# Patient Record
Sex: Female | Born: 1955 | Race: White | Hispanic: No | Marital: Married | Smoking: Never smoker
Health system: Southern US, Community
[De-identification: ages and names within clinical notes are randomized; demographics above are authoritative.]

## PROBLEM LIST (undated history)

## (undated) DIAGNOSIS — G43909 Migraine, unspecified, not intractable, without status migrainosus: Secondary | ICD-10-CM

## (undated) DIAGNOSIS — E785 Hyperlipidemia, unspecified: Secondary | ICD-10-CM

## (undated) DIAGNOSIS — N200 Calculus of kidney: Secondary | ICD-10-CM

## (undated) HISTORY — DX: Migraine, unspecified, not intractable, without status migrainosus: G43.909

## (undated) HISTORY — DX: Hyperlipidemia, unspecified: E78.5

## (undated) HISTORY — DX: Calculus of kidney: N20.0

## (undated) HISTORY — PX: KNEE SURGERY: SHX244

---

## 1999-06-04 ENCOUNTER — Other Ambulatory Visit: Admission: RE | Admit: 1999-06-04 | Discharge: 1999-06-04 | Payer: Self-pay | Admitting: Family Medicine

## 1999-10-13 ENCOUNTER — Ambulatory Visit (HOSPITAL_BASED_OUTPATIENT_CLINIC_OR_DEPARTMENT_OTHER): Admission: RE | Admit: 1999-10-13 | Discharge: 1999-10-14 | Payer: Self-pay | Admitting: Orthopaedic Surgery

## 2001-10-19 ENCOUNTER — Other Ambulatory Visit: Admission: RE | Admit: 2001-10-19 | Discharge: 2001-10-19 | Payer: Self-pay | Admitting: Obstetrics and Gynecology

## 2003-01-08 ENCOUNTER — Other Ambulatory Visit: Admission: RE | Admit: 2003-01-08 | Discharge: 2003-01-08 | Payer: Self-pay | Admitting: Obstetrics and Gynecology

## 2004-06-02 ENCOUNTER — Other Ambulatory Visit: Admission: RE | Admit: 2004-06-02 | Discharge: 2004-06-02 | Payer: Self-pay | Admitting: Obstetrics and Gynecology

## 2005-11-12 ENCOUNTER — Other Ambulatory Visit: Admission: RE | Admit: 2005-11-12 | Discharge: 2005-11-12 | Payer: Self-pay | Admitting: Obstetrics & Gynecology

## 2007-02-15 ENCOUNTER — Other Ambulatory Visit: Admission: RE | Admit: 2007-02-15 | Discharge: 2007-02-15 | Payer: Self-pay | Admitting: Obstetrics and Gynecology

## 2008-03-28 ENCOUNTER — Other Ambulatory Visit: Admission: RE | Admit: 2008-03-28 | Discharge: 2008-03-28 | Payer: Self-pay | Admitting: Obstetrics and Gynecology

## 2011-02-05 NOTE — Op Note (Signed)
[place]. [facility]  Fowler:    Angela Fowler                         MRN: [id number] Proc. Date: 10/13/99 Adm. Date:  [id number] Attending:  Marcene Corning                           Operative Report  PREOPERATIVE DIAGNOSES: 1. Right anterior cruciate ligament tear. 2. Right torn lateral meniscus.  POSTOPERATIVE DIAGNOSES: 1. Right anterior cruciate ligament tear. 2. Right chondromalacia of patella.  PROCEDURES: 1. Right ACL reconstruction. 2. Right knee chondroplasty.  ANESTHESIA:  General.  SURGEON:  Lubertha Basque. Jerl Santos, M.D.  ASSISTANT:  Angela Fowler, P.A.  INDICATIONS FOR PROCEDURE:  Angela Fowler is a 55 year old woman who is a very active athlete.  About 12 years ago, she sustained an injury to her right knee.  She states she underwent an arthroscopy which confirmed an ACL tear.  She had extensive rehabilitation and wore a brace, but unfortunately at this point, had giving away episodes when she tries to exert herself athletically.  She had stretched out her secondary stabilizers, and at this point is offered ACL reconstruction.  Angela procedure was discussed with Angela Fowler and informed operative consent was obtained after discussion of possible complications of reaction of anesthesia and infection.  Angela different graft types were discussed in detail.  She elected to go forth with an allograft.  DESCRIPTION OF PROCEDURE:  Angela Fowler was taken to Angela operating suite where a  general anesthetic was applied with LMA without difficulty.  She was positioned  supine, and prepped and draped in a normal sterile fashion.  After Angela administration of preoperative antibiotics and arthroscopy of Angela right knee was performed through two inferior portals.  Angela suprapatellar pouch was benign while Angela patellofemoral joint exhibited some grade 3 chondromalacia of Angela intertrochlear groove with some loose flaps of cartilage.  A  thorough chondroplasty was performed back to stable structures.  Angela patella actually tracked very well. Both gutters were benign.  Medial compartment and lateral compartment exhibited no evidence of meniscal or articular cartilage injury.  ACL was absent with a small remnant of scar tissue attached to Angela notch.  PCL was intact.  She had notch stenosis.  A thorough notchplasty was performed with an arthroscopic bur to open up this area.  A guidewire was placed into Angela knee through a stab wound on Angela anteromedial aspect of Angela tibia.  This entered Angela knee just anterior to Angela PCL, and was placed with an appropriate guide.  This was then over-reamed to a diameter of 10 mm.  A second guidewire was placed with a transtibial guide and over Angela op position.  This exited Angela thigh proximally.  A 9 mm reamer was then used to create a tunnel in Angela distal femur over this guidewire.  A 1-2 mm posterior wire was visualized and was intact.  A full radius resector was placed in Angela knee to remove residual bone chips.  While this was being performed, [name] was at a back table preparing Angela  allograft to fit through Angela 9 mm and 10 mm tunnels.  This was thoroughly _____. Drill holes were placed in each of Angela bone plugs and PDS sutures were placed in Angela leading bone plug while was a wire was placed in Angela trailing bone plug. Angela leading PDS sutures were then  threaded through a guidewire which passed through Angela tibial tunnel, Angela femoral tunnel, and out Angela proximal thigh.  These were used to guide Angela graft through both tunnels, and placed it in proper position.  Care was taken to keep Angela tendon insertion _______ in a posterior direction.  There was  some mismatch between Angela allograft and Angela Fowler, and as a result, Angela leading bone plug was split on itself to shorten Angela graft by about 2 cm.  Once this was accomplished, Angela graft was an appropriate length and fit into  Angela tunnel perfectly.  Angela knee was then hyperflexed and a guidewire placed anteriorly in he femoral tunnel.  An 8 x 25 metal [employer] screw was placed in an anterior position and interference fashion.  Once this was accomplished, Angela knee was ranged fully and Angela graft was found to be asymmetric.  A second guidewire was placed through Angela tibial tunnel and was seen to enter Angela knee, arthroscopically cleared, and a second identical screw was placed in an identical fashion over this guidewire to secure Angela tibial bone plug.  Angela knee was thoroughly irrigated.  Angela knee easily again to full extension with no impingement.  Arthroscopic equipment was removed.  Simple sutures of vertical mattress nylon were used to reapproximate Angela small wound on Angela anteromedial aspect of Angela tibia.  Adaptic was placed over all wounds and portal followed by dry gauze and loose Ace wrap.  Estimated blood loss and intraoperative fluids can be obtained from anesthesia records.  No tourniquet was used.  DISPOSITION:  Angela Fowler was extubated in Angela operating room and taken to Angela recovery room in stable condition.  Plans were for her to be admitted for overnight observation for pain control with plans to go home in Angela morning. DD:  10/13/99 TD:  10/13/99 Job: [id number]

## 2012-10-05 ENCOUNTER — Other Ambulatory Visit (HOSPITAL_COMMUNITY)
Admission: RE | Admit: 2012-10-05 | Discharge: 2012-10-05 | Disposition: A | Discharge status: Home / Self Care | Payer: BC Managed Care – PPO | Source: Ambulatory Visit | Attending: Family Medicine | Admitting: Family Medicine

## 2012-10-05 ENCOUNTER — Other Ambulatory Visit: Payer: Self-pay | Admitting: Family Medicine

## 2012-10-05 DIAGNOSIS — Z124 Encounter for screening for malignant neoplasm of cervix: Secondary | ICD-10-CM | POA: Insufficient documentation

## 2015-08-25 ENCOUNTER — Other Ambulatory Visit (HOSPITAL_COMMUNITY)
Admission: RE | Admit: 2015-08-25 | Discharge: 2015-08-25 | Disposition: A | Discharge status: Home / Self Care | Payer: BC Managed Care – PPO | Source: Ambulatory Visit | Attending: Family Medicine | Admitting: Family Medicine

## 2015-08-25 ENCOUNTER — Other Ambulatory Visit: Payer: Self-pay | Admitting: Family Medicine

## 2015-08-25 DIAGNOSIS — Z124 Encounter for screening for malignant neoplasm of cervix: Secondary | ICD-10-CM | POA: Insufficient documentation

## 2015-08-27 LAB — CYTOLOGY - PAP

## 2018-02-14 ENCOUNTER — Other Ambulatory Visit (HOSPITAL_COMMUNITY)
Admission: RE | Admit: 2018-02-14 | Discharge: 2018-02-14 | Disposition: A | Discharge status: Home / Self Care | Payer: BC Managed Care – PPO | Source: Ambulatory Visit | Attending: Family Medicine | Admitting: Family Medicine

## 2018-02-14 ENCOUNTER — Other Ambulatory Visit: Payer: Self-pay | Admitting: Family Medicine

## 2018-02-14 DIAGNOSIS — Z124 Encounter for screening for malignant neoplasm of cervix: Secondary | ICD-10-CM | POA: Diagnosis present

## 2018-02-16 LAB — CYTOLOGY - PAP: Diagnosis: NEGATIVE

## 2019-11-30 ENCOUNTER — Ambulatory Visit: Payer: BC Managed Care – PPO | Attending: Internal Medicine

## 2019-11-30 DIAGNOSIS — Z23 Encounter for immunization: Secondary | ICD-10-CM

## 2019-11-30 NOTE — Progress Notes (Signed)
   Covid-19 Vaccination Clinic  Name:  Angela Fowler    MRN: [id number] DOB: 05/27/1956  11/30/2019  [name] was observed post Covid-19 immunization for 15 minutes without incident. She was provided with Vaccine Information Sheet and instruction to access the V-Safe system.   [name] was instructed to call 911 with any severe reactions post vaccine: Marland Kitchen Difficulty breathing  . Swelling of face and throat  . A fast heartbeat  . A bad rash all over body  . Dizziness and weakness   Immunizations Administered    Name Date Dose VIS Date Route   Pfizer COVID-19 Vaccine 11/30/2019 10:50 AM 0.3 mL 08/31/2019 Intramuscular   Manufacturer: [employer], [employer]   Lot: [id number]   NDC: [address]-0

## 2019-12-26 ENCOUNTER — Ambulatory Visit: Payer: BC Managed Care – PPO | Attending: Internal Medicine

## 2019-12-26 DIAGNOSIS — Z23 Encounter for immunization: Secondary | ICD-10-CM

## 2019-12-26 NOTE — Progress Notes (Signed)
   Covid-19 Vaccination Clinic  Name:  Amra Shukla    MRN: [id number] DOB: 12-Jul-1956  12/26/2019  Ms. Pippen was observed post Covid-19 immunization for 15 minutes without incident. She was provided with Vaccine Information Sheet and instruction to access the V-Safe system.   Ms. Gavel was instructed to call 911 with any severe reactions post vaccine: Marland Kitchen Difficulty breathing  . Swelling of face and throat  . A fast heartbeat  . A bad rash all over body  . Dizziness and weakness   Immunizations Administered    Name Date Dose VIS Date Route   Pfizer COVID-19 Vaccine 12/26/2019  9:08 AM 0.3 mL 08/31/2019 Intramuscular   Manufacturer: [employer], [employer]   Lot: [id number]   NDC: [address]-9

## 2020-03-08 ENCOUNTER — Other Ambulatory Visit: Payer: Self-pay

## 2020-03-08 ENCOUNTER — Ambulatory Visit (HOSPITAL_COMMUNITY): Admission: EM | Admit: 2020-03-08 | Payer: BC Managed Care – PPO | Source: Home / Self Care

## 2021-10-07 NOTE — Progress Notes (Signed)
Date:  10/08/2021   ID:  Angela Fowler, DOB 03/27/1956, MRN [id number]  PCP:  [facility] @ [place]  Cardiologist:  Rex Kras, DO, [facility] (established care 10/08/2021)  REASON FOR CONSULT: Chest tightness   REQUESTING PHYSICIAN:  [facility] [place] @ [place],  [place] [address]  Chief Complaint  Patient presents with   Chest Pain   New Patient (Initial Visit)    HPI  Angela Fowler is a 66 y.o. Caucasian female who presents to the office with a chief complaint of " chest pain." Patient's past medical history and cardiovascular risk factors include: Hyperlipidemia, nephrolithiasis, diverticulosis, family history of heart disease, migraine, postmenopausal female.  She is referred to the office at the request of [facility], [facility] for evaluation of chest tightness.  Chest pain: Patient has been experiencing discomfort underneath her left breast, describes it as a squeezing/tightness like sensation, usually more noticeable while she is in bed either going to sleep or waking up.  The discomfort has been present for the last 6 to 7 months.  Initially she thought it could be musculoskeletal however the discomfort has not changed with stretches and exercises.  The discomfort is intermittently present, lasting 15 to 20 minutes, self-limited.  The chest discomfort is not brought on by effort related activities and does not resolve with rest.  No change in overall functional status.  She walks 3 to 4 miles 3 days a week and also participates in yoga class.  No family history of premature coronary disease or sudden cardiac death.  However, 2 brothers had myocardial infarctions in their mid 15s.  FUNCTIONAL STATUS: Walks 3 miles about 3-4 times a week and also participates in yoga class.  ALLERGIES: No Known Allergies  MEDICATION LIST PRIOR TO VISIT: Current Meds  Medication Sig   fluticasone (FLONASE) 50 MCG/ACT nasal spray  Place 2 sprays into both nostrils daily.   naproxen sodium (ALEVE) 220 MG tablet Take by mouth.   rizatriptan (MAXALT-MLT) 5 MG disintegrating tablet Take 1 tablet by mouth as needed.     PAST MEDICAL HISTORY: Past Medical History:  Diagnosis Date   Hyperlipidemia    Migraine    Nephrolithiasis     PAST SURGICAL HISTORY: Past Surgical History:  Procedure Laterality Date   KNEE SURGERY Right     FAMILY HISTORY: The patient family history includes Cancer in her father; Heart disease in her brother, brother, and mother; Heart failure in her mother.  SOCIAL HISTORY:  The patient  reports that she has never smoked. She has never used smokeless tobacco. She reports current alcohol use. She reports that she does not use drugs.  REVIEW OF SYSTEMS: Review of Systems  Constitutional: Negative for chills and fever.  HENT:  Negative for hoarse voice and nosebleeds.   Eyes:  Negative for discharge, double vision and pain.  Cardiovascular:  Negative for chest pain, claudication, dyspnea on exertion, leg swelling, near-syncope, orthopnea, palpitations, paroxysmal nocturnal dyspnea and syncope.  Respiratory:  Negative for hemoptysis and shortness of breath.   Musculoskeletal:  Negative for muscle cramps and myalgias.  Gastrointestinal:  Negative for abdominal pain, constipation, diarrhea, hematemesis, hematochezia, melena, nausea and vomiting.  Neurological:  Negative for dizziness and light-headedness.   PHYSICAL EXAM: Vitals with BMI 10/08/2021  Height $Remov'5\' 2"'zyZQKS$   Weight 151 lbs  BMI 17.49  Systolic 449  Diastolic 73  Pulse 84    CONSTITUTIONAL: Well-developed and well-nourished. No acute distress.  SKIN: Skin  is warm and dry. No rash noted. No cyanosis. No pallor. No jaundice HEAD: Normocephalic and atraumatic.  EYES: No scleral icterus MOUTH/THROAT: Moist oral membranes.  NECK: No JVD present. No thyromegaly noted. No carotid bruits  LYMPHATIC: No visible cervical adenopathy.   CHEST Normal respiratory effort. No intercostal retractions  LUNGS: Clear to auscultation bilaterally.  No stridor. No wheezes. No rales.  CARDIOVASCULAR: Regular rate and rhythm, positive S1-S2, no murmurs rubs or gallops appreciated. ABDOMINAL: Obese, soft, nontender, nondistended, positive bowel sounds all 4 quadrants. No apparent ascites.  EXTREMITIES: No peripheral edema, warm to touch, +2 bilateral DP and PT pulses HEMATOLOGIC: No significant bruising NEUROLOGIC: Oriented to person, place, and time. Nonfocal. Normal muscle tone.  PSYCHIATRIC: Normal mood and affect. Normal behavior. Cooperative  CARDIAC DATABASE: EKG: 10/08/2021: NSR, 79 bpm, low voltage in the precordial leads, nonspecific ST-T changes in the high lateral leads, without underlying injury pattern.   Echocardiogram: No results found for this or any previous visit from the past 1095 days.   Stress Testing: No results found for this or any previous visit from the past 1095 days.   Heart Catheterization: None  LABORATORY DATA: No flowsheet data found.  No flowsheet data found.  Lipid Panel  No results found for: CHOL, TRIG, HDL, CHOLHDL, VLDL, LDLCALC, LDLDIRECT, LABVLDL  No components found for: NTPROBNP No results for input(s): PROBNP in the last 8760 hours. No results for input(s): TSH in the last 8760 hours.  BMP No results for input(s): NA, K, CL, CO2, GLUCOSE, BUN, CREATININE, CALCIUM, GFRNONAA, GFRAA in the last 8760 hours.  HEMOGLOBIN A1C No results found for: HGBA1C, MPG  External Labs:  Date Collected: 03/10/2021 , information obtained by referring provider Potassium: 4.7 Creatinine 0.67 mg/dL. eGFR: 97 mL/min per 1.73 m Hemoglobin: 13.2 g/dL and hematocrit: 39.6 % Lipid profile: Total cholesterol 216 , triglycerides 161 , HDL 53 , LDL 135, non-HDL 164 AST: 17 , ALT: 14 , alkaline phosphatase: 68  TSH: 3.65    IMPRESSION:    ICD-10-CM   1. Precordial pain  R07.2 EKG 12-Lead    PCV  ECHOCARDIOGRAM COMPLETE    PCV CARDIAC STRESS TEST    CT CARDIAC SCORING ([facility] LOCATIONS ONLY)    2. Pure hypercholesterolemia  E78.00     3. Family history of heart disease  Z82.49        RECOMMENDATIONS: [name] is a 66 y.o. Caucasian female whose past medical history and cardiac risk factors include: Hyperlipidemia, nephrolithiasis, diverticulosis, family history of heart disease, migraine, postmenopausal female.  Precordial pain Not entirely cardiac discomfort, but due to risk factors additional work-up warranted. EKG: Sinus, nonspecific ST-T changes in the high lateral leads Echocardiogram will be ordered to evaluate for structural heart disease and left ventricular systolic function. Exercise treadmill stress test to evaluate for functional status and exercise-induced ischemia/arrhythmia. Coronary calcium score for further risk stratification given her hyperlipidemia and family history of CAD.  Pure hypercholesterolemia Most recent labs from June 2022 reviewed. 10-year risk of ASCVD 4.9%. Coronary calcium score ordered for further risk stratification. Further recommendations forthcoming.  Family history of heart disease Educated on the importance of improving her modifiable cardiovascular risk factors.  Work-up as discussed above.  FINAL MEDICATION LIST END OF ENCOUNTER: No orders of the defined types were placed in this encounter.   There are no discontinued medications.   Current Outpatient Medications:    fluticasone (FLONASE) 50 MCG/ACT nasal spray, Place 2 sprays into both nostrils daily., Disp: , Rfl:  naproxen sodium (ALEVE) 220 MG tablet, Take by mouth., Disp: , Rfl:    rizatriptan (MAXALT-MLT) 5 MG disintegrating tablet, Take 1 tablet by mouth as needed., Disp: , Rfl:   Orders Placed This Encounter  Procedures   CT CARDIAC SCORING ([facility] LOCATIONS ONLY)   PCV CARDIAC STRESS TEST   EKG 12-Lead   PCV ECHOCARDIOGRAM COMPLETE    There are no Patient  Instructions on file for this visit.   --Continue cardiac medications as reconciled in final medication list. --Return in about 4 weeks (around 11/05/2021) for Follow up, Chest pain, Review test results. Or sooner if needed. --Continue follow-up with your primary care physician regarding the management of your other chronic comorbid conditions.  Patient's questions and concerns were addressed to her satisfaction. She voices understanding of the instructions provided during this encounter.   This note was created using a voice recognition software as a result there may be grammatical errors inadvertently enclosed that do not reflect the nature of this encounter. Every attempt is made to correct such errors.  Rex Kras, [place], [facility]  Pager: [phone] Office: [phone]

## 2021-10-08 ENCOUNTER — Encounter: Payer: Self-pay | Admitting: Cardiology

## 2021-10-08 ENCOUNTER — Other Ambulatory Visit: Payer: Self-pay

## 2021-10-08 ENCOUNTER — Ambulatory Visit: Payer: BC Managed Care – PPO | Admitting: Cardiology

## 2021-10-08 VITALS — BP 122/73 | HR 84 | Temp 98.0°F | Resp 16 | Ht 62.0 in | Wt 151.0 lb

## 2021-10-08 DIAGNOSIS — R072 Precordial pain: Secondary | ICD-10-CM

## 2021-10-08 DIAGNOSIS — Z8249 Family history of ischemic heart disease and other diseases of the circulatory system: Secondary | ICD-10-CM

## 2021-10-08 DIAGNOSIS — E7800 Pure hypercholesterolemia, unspecified: Secondary | ICD-10-CM

## 2021-10-19 ENCOUNTER — Other Ambulatory Visit: Payer: Self-pay

## 2021-10-19 ENCOUNTER — Ambulatory Visit: Payer: BC Managed Care – PPO

## 2021-10-19 DIAGNOSIS — R072 Precordial pain: Secondary | ICD-10-CM

## 2021-10-29 ENCOUNTER — Ambulatory Visit
Admission: RE | Admit: 2021-10-29 | Discharge: 2021-10-29 | Disposition: A | Discharge status: Home / Self Care | Payer: No Typology Code available for payment source | Source: Ambulatory Visit | Attending: Cardiology | Admitting: Cardiology

## 2021-10-29 DIAGNOSIS — R072 Precordial pain: Secondary | ICD-10-CM

## 2021-11-03 NOTE — Progress Notes (Signed)
Results fwd to PCP. Called and spoke to pt, pt aware.

## 2021-11-06 ENCOUNTER — Ambulatory Visit: Payer: BC Managed Care – PPO

## 2021-11-06 ENCOUNTER — Other Ambulatory Visit: Payer: Self-pay

## 2021-11-06 DIAGNOSIS — R072 Precordial pain: Secondary | ICD-10-CM

## 2021-11-13 ENCOUNTER — Ambulatory Visit: Payer: BC Managed Care – PPO | Admitting: Cardiology

## 2021-11-17 ENCOUNTER — Ambulatory Visit: Payer: BC Managed Care – PPO | Admitting: Cardiology

## 2021-11-17 ENCOUNTER — Other Ambulatory Visit: Payer: Self-pay

## 2021-11-17 ENCOUNTER — Encounter: Payer: Self-pay | Admitting: Cardiology

## 2021-11-17 VITALS — BP 119/74 | HR 91 | Temp 97.8°F | Resp 16 | Ht 62.0 in | Wt 147.6 lb

## 2021-11-17 DIAGNOSIS — Z712 Person consulting for explanation of examination or test findings: Secondary | ICD-10-CM

## 2021-11-17 DIAGNOSIS — E7800 Pure hypercholesterolemia, unspecified: Secondary | ICD-10-CM

## 2021-11-17 DIAGNOSIS — R072 Precordial pain: Secondary | ICD-10-CM

## 2021-11-17 DIAGNOSIS — Z8249 Family history of ischemic heart disease and other diseases of the circulatory system: Secondary | ICD-10-CM

## 2021-11-17 NOTE — Progress Notes (Signed)
Date:  11/17/2021   ID:  Angela Fowler, DOB Jun 19, 1956, MRN [id number]  PCP:  Daisy Floro, MD  Cardiologist:  Tessa Lerner, DO, [facility] (established care 10/08/2021)  Date: 11/17/21 Last Office Visit: 10/08/2021  Chief Complaint  Patient presents with   Chest Pain   Results   Follow-up    HPI  Angela Fowler is a 66 y.o. Caucasian female who presents to the office with a chief complaint of " reevaluation of chest pain and discuss test results." Patient's past medical history and cardiovascular risk factors include: Hyperlipidemia, nephrolithiasis, diverticulosis, family history of heart disease, migraine, postmenopausal female.  She is referred to the office at the request of [facility] for evaluation of chest tightness.  Patient presented to the office back in January 2023 after being referred for evaluation of chest tightness.  Her symptoms of precordial discomfort appear to be not entirely cardiac but given her risk factors additional work-up was undertaken.  She underwent an echocardiogram, exercise treadmill stress test, and a coronary artery calcium score.  Results reviewed with her in great detail and noted below for further reference.  Clinically she no longer is experiencing chest pain/tightness.  No heart failure symptoms.  No recent hospitalizations or urgent care visits for cardiovascular symptoms.  Her functional status remains relatively stable she walks 3 to 4 miles 3 days a week and also does yoga classes.  No family history of premature coronary disease or sudden cardiac death.  However, 2 brothers had myocardial infarctions in their mid 49s.  FUNCTIONAL STATUS: Walks 3 miles about 3-4 times a week and also participates in yoga class.  ALLERGIES: No Known Allergies  MEDICATION LIST PRIOR TO VISIT: Current Meds  Medication Sig   fluticasone (FLONASE) 50 MCG/ACT nasal spray Place 2 sprays into both nostrils daily.   naproxen sodium (ALEVE) 220 MG  tablet Take by mouth.   rizatriptan (MAXALT-MLT) 5 MG disintegrating tablet Take 1 tablet by mouth as needed.     PAST MEDICAL HISTORY: Past Medical History:  Diagnosis Date   Hyperlipidemia    Migraine    Nephrolithiasis     PAST SURGICAL HISTORY: Past Surgical History:  Procedure Laterality Date   KNEE SURGERY Right     FAMILY HISTORY: The patient family history includes Cancer in her father; Heart disease in her brother, brother, and mother; Heart failure in her mother.  SOCIAL HISTORY:  The patient  reports that she has never smoked. She has never used smokeless tobacco. She reports current alcohol use. She reports that she does not use drugs.  REVIEW OF SYSTEMS: Review of Systems  Cardiovascular:  Negative for chest pain, dyspnea on exertion, leg swelling, orthopnea, palpitations, paroxysmal nocturnal dyspnea and syncope.  Respiratory:  Negative for shortness of breath.    PHYSICAL EXAM: Vitals with BMI 11/17/2021 10/08/2021  Height 5\' 2"  5\' 2"   Weight 147 lbs 10 oz 151 lbs  BMI 26.99 27.61  Systolic 119 122  Diastolic 74 73  Pulse 91 84    CONSTITUTIONAL: Well-developed and well-nourished. No acute distress.  SKIN: Skin is warm and dry. No rash noted. No cyanosis. No pallor. No jaundice HEAD: Normocephalic and atraumatic.  EYES: No scleral icterus MOUTH/THROAT: Moist oral membranes.  NECK: No JVD present. No thyromegaly noted. No carotid bruits  LYMPHATIC: No visible cervical adenopathy.  CHEST Normal respiratory effort. No intercostal retractions  LUNGS: Clear to auscultation bilaterally.  No stridor. No wheezes. No rales.  CARDIOVASCULAR: Regular rate and rhythm,  positive S1-S2, no murmurs rubs or gallops appreciated. ABDOMINAL: Obese, soft, nontender, nondistended, positive bowel sounds all 4 quadrants. No apparent ascites.  EXTREMITIES: No peripheral edema, warm to touch, +2 bilateral DP and PT pulses HEMATOLOGIC: No significant bruising NEUROLOGIC:  Oriented to person, place, and time. Nonfocal. Normal muscle tone.  PSYCHIATRIC: Normal mood and affect. Normal behavior. Cooperative  CARDIAC DATABASE: EKG: 10/08/2021: NSR, 79 bpm, low voltage in the precordial leads, nonspecific ST-T changes in the high lateral leads, without underlying injury pattern.   Echocardiogram: 10/19/2021: Left ventricle cavity is normal in size and wall thickness. Normal global wall motion. Normal LV systolic function with EF 58%. Doppler evidence of grade I (impaired) diastolic dysfunction, normal LAP.  Mild (Grade I) mitral regurgitation. Mild tricuspid regurgitation. No evidence of pulmonary hypertension. Incidental finding of 3X3 cm hypoechoic structure in liver, probably simple cyst.   Stress Testing: Exercise treadmill stress test 11/06/2021: Exercise treadmill stress test performed using Bruce protocol. Patient reached 8.5 METS, and 101% of age predicted maximum heart rate. Exercise capacity was low. No chest pain reported. Normal heart rate and hemodynamic response. Stress EKG revealed no ischemic changes. Low risk study.  Heart Catheterization: None  CT Cardiac Scoring: 10/30/2021 Total CAC 0AU. Noncardiac findings: 2 mm pulmonary nodule in the right middle lobe, nonspecific, but statistically likely benign. No follow-up is needed.  LABORATORY DATA: No flowsheet data found.  No flowsheet data found.  Lipid Panel  No results found for: CHOL, TRIG, HDL, CHOLHDL, VLDL, LDLCALC, LDLDIRECT, LABVLDL  No components found for: NTPROBNP No results for input(s): PROBNP in the last 8760 hours. No results for input(s): TSH in the last 8760 hours.  BMP No results for input(s): NA, K, CL, CO2, GLUCOSE, BUN, CREATININE, CALCIUM, GFRNONAA, GFRAA in the last 8760 hours.  HEMOGLOBIN A1C No results found for: HGBA1C, MPG  External Labs:  Date Collected: 03/10/2021 , information obtained by referring provider Potassium: 4.7 Creatinine 0.67 mg/dL. eGFR:  97 mL/min per 1.73 m Hemoglobin: 13.2 g/dL and hematocrit: 39.6 % Lipid profile: Total cholesterol 216 , triglycerides 161 , HDL 53 , LDL 135, non-HDL 164 AST: 17 , ALT: 14 , alkaline phosphatase: 68  TSH: 3.65    IMPRESSION:    ICD-10-CM   1. Precordial pain  R07.2     2. Pure hypercholesterolemia  E78.00     3. Family history of heart disease  Z82.49     4. Encounter to discuss test results  Z71.2         RECOMMENDATIONS: Angela Fowler is a 66 y.o. Caucasian female whose past medical history and cardiac risk factors include: Hyperlipidemia, nephrolithiasis, diverticulosis, family history of heart disease, migraine, postmenopausal female.  Precordial pain Asymptomatic, since last visit. Echocardiogram: Preserved LVEF, grade 1 diastolic dysfunction, mild MR/TR. GXT: Low risk study. Coronary calcium score: 0 No additional work-up warranted at this time.  Pure hypercholesterolemia Patient is educated on the importance of improving her modifiable cardiovascular risk factors, increasing physical activity as tolerated with a goal of 30 minutes a day 5 days a week, consuming foods that are low in cholesterol. Total coronary calcium score is 0. 10-year risk of ASCVD is less than 5%. The shared decision was to hold off on pharmacological therapy for now and to have it reevaluated.  Patient states that she follows up with her PCP next week and will have repeat labs. Monitor for now  Encounter to discuss test results Noted to have an incidental 2 mm pulmonary nodule noted on  coronary calcium report.  She is a non-smoker and therefore likely insignificant.  However, I have asked her to follow-up with her PCP to see if additional work-up is warranted.  We will defer additional management to PCP for now. Echocardiogram also noted an incidental finding of a possible liver cyst.  I have asked her to discuss this further with her PCP and consider ultrasound of the abdomen for further  evaluation.  No additional cardiovascular work-up warranted at this time.  However, given the fact she has risk factors that shared decision was to follow-up on annual basis for primary prevention.   Discussed management of at least 2 conditions, reviewed results of the echo, stress test, coronary calcium report, discussed the importance of primary prevention and annual follow up or sooner if needed.   FINAL MEDICATION LIST END OF ENCOUNTER: No orders of the defined types were placed in this encounter.   There are no discontinued medications.   Current Outpatient Medications:    fluticasone (FLONASE) 50 MCG/ACT nasal spray, Place 2 sprays into both nostrils daily., Disp: , Rfl:    naproxen sodium (ALEVE) 220 MG tablet, Take by mouth., Disp: , Rfl:    rizatriptan (MAXALT-MLT) 5 MG disintegrating tablet, Take 1 tablet by mouth as needed., Disp: , Rfl:   No orders of the defined types were placed in this encounter.   There are no Patient Instructions on file for this visit.   --Continue cardiac medications as reconciled in final medication list. --Return in about 1 year (around 11/17/2022) for Follow up . Or sooner if needed. --Continue follow-up with your primary care physician regarding the management of your other chronic comorbid conditions.  Patient's questions and concerns were addressed to her satisfaction. She voices understanding of the instructions provided during this encounter.   This note was created using a voice recognition software as a result there may be grammatical errors inadvertently enclosed that do not reflect the nature of this encounter. Every attempt is made to correct such errors.  Rex Kras, [place], [facility]  Pager: [phone] Office: [phone]

## 2021-11-26 ENCOUNTER — Other Ambulatory Visit: Payer: Self-pay | Admitting: Family Medicine

## 2021-11-26 DIAGNOSIS — R932 Abnormal findings on diagnostic imaging of liver and biliary tract: Secondary | ICD-10-CM

## 2021-11-27 ENCOUNTER — Other Ambulatory Visit: Payer: Self-pay | Admitting: Family Medicine

## 2021-11-27 DIAGNOSIS — R932 Abnormal findings on diagnostic imaging of liver and biliary tract: Secondary | ICD-10-CM

## 2021-12-07 ENCOUNTER — Ambulatory Visit
Admission: RE | Admit: 2021-12-07 | Discharge: 2021-12-07 | Disposition: A | Discharge status: Home / Self Care | Payer: BC Managed Care – PPO | Source: Ambulatory Visit | Attending: Family Medicine | Admitting: Family Medicine

## 2021-12-07 DIAGNOSIS — R932 Abnormal findings on diagnostic imaging of liver and biliary tract: Secondary | ICD-10-CM

## 2022-04-11 DIAGNOSIS — J069 Acute upper respiratory infection, unspecified: Secondary | ICD-10-CM | POA: Diagnosis not present

## 2022-04-11 DIAGNOSIS — J0100 Acute maxillary sinusitis, unspecified: Secondary | ICD-10-CM | POA: Diagnosis not present

## 2022-04-15 DIAGNOSIS — G4719 Other hypersomnia: Secondary | ICD-10-CM | POA: Diagnosis not present

## 2022-04-16 DIAGNOSIS — J3089 Other allergic rhinitis: Secondary | ICD-10-CM | POA: Diagnosis not present

## 2022-04-16 DIAGNOSIS — R059 Cough, unspecified: Secondary | ICD-10-CM | POA: Diagnosis not present

## 2022-04-16 DIAGNOSIS — H1045 Other chronic allergic conjunctivitis: Secondary | ICD-10-CM | POA: Diagnosis not present

## 2022-05-13 DIAGNOSIS — G4733 Obstructive sleep apnea (adult) (pediatric): Secondary | ICD-10-CM | POA: Diagnosis not present

## 2022-05-14 DIAGNOSIS — G4733 Obstructive sleep apnea (adult) (pediatric): Secondary | ICD-10-CM | POA: Diagnosis not present

## 2022-06-14 DIAGNOSIS — G4733 Obstructive sleep apnea (adult) (pediatric): Secondary | ICD-10-CM | POA: Diagnosis not present

## 2022-06-24 DIAGNOSIS — G4733 Obstructive sleep apnea (adult) (pediatric): Secondary | ICD-10-CM | POA: Diagnosis not present

## 2022-07-14 DIAGNOSIS — G4733 Obstructive sleep apnea (adult) (pediatric): Secondary | ICD-10-CM | POA: Diagnosis not present

## 2022-07-27 DIAGNOSIS — J342 Deviated nasal septum: Secondary | ICD-10-CM | POA: Diagnosis not present

## 2022-07-27 DIAGNOSIS — J343 Hypertrophy of nasal turbinates: Secondary | ICD-10-CM | POA: Diagnosis not present

## 2022-07-27 DIAGNOSIS — J310 Chronic rhinitis: Secondary | ICD-10-CM | POA: Diagnosis not present

## 2022-07-27 DIAGNOSIS — R0982 Postnasal drip: Secondary | ICD-10-CM | POA: Diagnosis not present

## 2022-08-02 DIAGNOSIS — Z131 Encounter for screening for diabetes mellitus: Secondary | ICD-10-CM | POA: Diagnosis not present

## 2022-08-02 DIAGNOSIS — E7800 Pure hypercholesterolemia, unspecified: Secondary | ICD-10-CM | POA: Diagnosis not present

## 2022-08-04 DIAGNOSIS — J3089 Other allergic rhinitis: Secondary | ICD-10-CM | POA: Diagnosis not present

## 2022-08-04 DIAGNOSIS — H1045 Other chronic allergic conjunctivitis: Secondary | ICD-10-CM | POA: Diagnosis not present

## 2022-08-05 DIAGNOSIS — G4733 Obstructive sleep apnea (adult) (pediatric): Secondary | ICD-10-CM | POA: Diagnosis not present

## 2022-08-14 DIAGNOSIS — G4733 Obstructive sleep apnea (adult) (pediatric): Secondary | ICD-10-CM | POA: Diagnosis not present

## 2022-08-21 DIAGNOSIS — R059 Cough, unspecified: Secondary | ICD-10-CM | POA: Diagnosis not present

## 2022-08-21 DIAGNOSIS — J40 Bronchitis, not specified as acute or chronic: Secondary | ICD-10-CM | POA: Diagnosis not present

## 2022-08-24 DIAGNOSIS — E2839 Other primary ovarian failure: Secondary | ICD-10-CM | POA: Diagnosis not present

## 2022-08-24 DIAGNOSIS — Z6828 Body mass index (BMI) 28.0-28.9, adult: Secondary | ICD-10-CM | POA: Diagnosis not present

## 2022-08-24 DIAGNOSIS — Z0000 Encounter for general adult medical examination without abnormal findings: Secondary | ICD-10-CM | POA: Diagnosis not present

## 2022-09-02 DIAGNOSIS — M8589 Other specified disorders of bone density and structure, multiple sites: Secondary | ICD-10-CM | POA: Diagnosis not present

## 2022-09-02 DIAGNOSIS — Z23 Encounter for immunization: Secondary | ICD-10-CM | POA: Diagnosis not present

## 2022-09-02 DIAGNOSIS — Z780 Asymptomatic menopausal state: Secondary | ICD-10-CM | POA: Diagnosis not present

## 2022-09-06 DIAGNOSIS — D225 Melanocytic nevi of trunk: Secondary | ICD-10-CM | POA: Diagnosis not present

## 2022-09-06 DIAGNOSIS — D1801 Hemangioma of skin and subcutaneous tissue: Secondary | ICD-10-CM | POA: Diagnosis not present

## 2022-09-06 DIAGNOSIS — D2271 Melanocytic nevi of right lower limb, including hip: Secondary | ICD-10-CM | POA: Diagnosis not present

## 2022-09-06 DIAGNOSIS — L570 Actinic keratosis: Secondary | ICD-10-CM | POA: Diagnosis not present

## 2022-09-06 DIAGNOSIS — L814 Other melanin hyperpigmentation: Secondary | ICD-10-CM | POA: Diagnosis not present

## 2022-09-07 DIAGNOSIS — J310 Chronic rhinitis: Secondary | ICD-10-CM | POA: Diagnosis not present

## 2022-09-07 DIAGNOSIS — J342 Deviated nasal septum: Secondary | ICD-10-CM | POA: Diagnosis not present

## 2022-09-07 DIAGNOSIS — J343 Hypertrophy of nasal turbinates: Secondary | ICD-10-CM | POA: Diagnosis not present

## 2022-09-15 DIAGNOSIS — M8580 Other specified disorders of bone density and structure, unspecified site: Secondary | ICD-10-CM | POA: Diagnosis not present

## 2022-09-15 DIAGNOSIS — J309 Allergic rhinitis, unspecified: Secondary | ICD-10-CM | POA: Diagnosis not present

## 2022-09-15 DIAGNOSIS — G8929 Other chronic pain: Secondary | ICD-10-CM | POA: Diagnosis not present

## 2022-09-15 DIAGNOSIS — G43909 Migraine, unspecified, not intractable, without status migrainosus: Secondary | ICD-10-CM | POA: Diagnosis not present

## 2022-09-15 DIAGNOSIS — E663 Overweight: Secondary | ICD-10-CM | POA: Diagnosis not present

## 2022-09-15 DIAGNOSIS — H109 Unspecified conjunctivitis: Secondary | ICD-10-CM | POA: Diagnosis not present

## 2022-09-15 DIAGNOSIS — G4733 Obstructive sleep apnea (adult) (pediatric): Secondary | ICD-10-CM | POA: Diagnosis not present

## 2022-09-28 DIAGNOSIS — H04123 Dry eye syndrome of bilateral lacrimal glands: Secondary | ICD-10-CM | POA: Diagnosis not present

## 2022-09-28 DIAGNOSIS — H524 Presbyopia: Secondary | ICD-10-CM | POA: Diagnosis not present

## 2022-10-06 DIAGNOSIS — G4732 High altitude periodic breathing: Secondary | ICD-10-CM | POA: Diagnosis not present

## 2022-10-06 DIAGNOSIS — G4733 Obstructive sleep apnea (adult) (pediatric): Secondary | ICD-10-CM | POA: Diagnosis not present

## 2022-10-25 DIAGNOSIS — M25562 Pain in left knee: Secondary | ICD-10-CM | POA: Diagnosis not present

## 2022-11-17 ENCOUNTER — Ambulatory Visit: Payer: BC Managed Care – PPO | Admitting: Cardiology

## 2022-11-26 DIAGNOSIS — M25562 Pain in left knee: Secondary | ICD-10-CM | POA: Diagnosis not present

## 2022-11-29 DIAGNOSIS — M25562 Pain in left knee: Secondary | ICD-10-CM | POA: Diagnosis not present

## 2022-12-01 DIAGNOSIS — M25562 Pain in left knee: Secondary | ICD-10-CM | POA: Diagnosis not present

## 2022-12-21 DIAGNOSIS — Z1231 Encounter for screening mammogram for malignant neoplasm of breast: Secondary | ICD-10-CM | POA: Diagnosis not present

## 2023-01-12 DIAGNOSIS — M25562 Pain in left knee: Secondary | ICD-10-CM | POA: Diagnosis not present

## 2023-01-13 DIAGNOSIS — G4733 Obstructive sleep apnea (adult) (pediatric): Secondary | ICD-10-CM | POA: Diagnosis not present

## 2023-01-13 DIAGNOSIS — G4732 High altitude periodic breathing: Secondary | ICD-10-CM | POA: Diagnosis not present

## 2023-02-10 DIAGNOSIS — G8918 Other acute postprocedural pain: Secondary | ICD-10-CM | POA: Diagnosis not present

## 2023-02-10 DIAGNOSIS — M1712 Unilateral primary osteoarthritis, left knee: Secondary | ICD-10-CM | POA: Diagnosis not present

## 2023-02-10 DIAGNOSIS — M948X6 Other specified disorders of cartilage, lower leg: Secondary | ICD-10-CM | POA: Diagnosis not present

## 2023-02-10 DIAGNOSIS — Y999 Unspecified external cause status: Secondary | ICD-10-CM | POA: Diagnosis not present

## 2023-02-10 DIAGNOSIS — X58XXXA Exposure to other specified factors, initial encounter: Secondary | ICD-10-CM | POA: Diagnosis not present

## 2023-02-10 DIAGNOSIS — S83232A Complex tear of medial meniscus, current injury, left knee, initial encounter: Secondary | ICD-10-CM | POA: Diagnosis not present

## 2023-02-16 DIAGNOSIS — M25562 Pain in left knee: Secondary | ICD-10-CM | POA: Diagnosis not present

## 2023-02-16 DIAGNOSIS — R531 Weakness: Secondary | ICD-10-CM | POA: Diagnosis not present

## 2023-02-16 DIAGNOSIS — M25662 Stiffness of left knee, not elsewhere classified: Secondary | ICD-10-CM | POA: Diagnosis not present

## 2023-02-21 DIAGNOSIS — R531 Weakness: Secondary | ICD-10-CM | POA: Diagnosis not present

## 2023-02-21 DIAGNOSIS — M25662 Stiffness of left knee, not elsewhere classified: Secondary | ICD-10-CM | POA: Diagnosis not present

## 2023-02-23 DIAGNOSIS — R531 Weakness: Secondary | ICD-10-CM | POA: Diagnosis not present

## 2023-02-23 DIAGNOSIS — M25662 Stiffness of left knee, not elsewhere classified: Secondary | ICD-10-CM | POA: Diagnosis not present

## 2023-03-01 DIAGNOSIS — R531 Weakness: Secondary | ICD-10-CM | POA: Diagnosis not present

## 2023-03-01 DIAGNOSIS — M25662 Stiffness of left knee, not elsewhere classified: Secondary | ICD-10-CM | POA: Diagnosis not present

## 2023-03-03 DIAGNOSIS — R531 Weakness: Secondary | ICD-10-CM | POA: Diagnosis not present

## 2023-03-03 DIAGNOSIS — M25662 Stiffness of left knee, not elsewhere classified: Secondary | ICD-10-CM | POA: Diagnosis not present

## 2023-03-17 DIAGNOSIS — M25662 Stiffness of left knee, not elsewhere classified: Secondary | ICD-10-CM | POA: Diagnosis not present

## 2023-03-17 DIAGNOSIS — R531 Weakness: Secondary | ICD-10-CM | POA: Diagnosis not present

## 2023-03-22 DIAGNOSIS — R531 Weakness: Secondary | ICD-10-CM | POA: Diagnosis not present

## 2023-03-22 DIAGNOSIS — M25662 Stiffness of left knee, not elsewhere classified: Secondary | ICD-10-CM | POA: Diagnosis not present

## 2023-03-25 DIAGNOSIS — R531 Weakness: Secondary | ICD-10-CM | POA: Diagnosis not present

## 2023-03-25 DIAGNOSIS — M25662 Stiffness of left knee, not elsewhere classified: Secondary | ICD-10-CM | POA: Diagnosis not present

## 2023-03-28 DIAGNOSIS — R531 Weakness: Secondary | ICD-10-CM | POA: Diagnosis not present

## 2023-03-28 DIAGNOSIS — M25662 Stiffness of left knee, not elsewhere classified: Secondary | ICD-10-CM | POA: Diagnosis not present

## 2023-05-26 DIAGNOSIS — G4733 Obstructive sleep apnea (adult) (pediatric): Secondary | ICD-10-CM | POA: Diagnosis not present

## 2023-06-06 DIAGNOSIS — M546 Pain in thoracic spine: Secondary | ICD-10-CM | POA: Diagnosis not present

## 2023-06-06 DIAGNOSIS — M545 Low back pain, unspecified: Secondary | ICD-10-CM | POA: Diagnosis not present

## 2023-06-06 DIAGNOSIS — M4696 Unspecified inflammatory spondylopathy, lumbar region: Secondary | ICD-10-CM | POA: Diagnosis not present

## 2023-06-25 DIAGNOSIS — M545 Low back pain, unspecified: Secondary | ICD-10-CM | POA: Diagnosis not present

## 2023-07-02 DIAGNOSIS — M545 Low back pain, unspecified: Secondary | ICD-10-CM | POA: Diagnosis not present

## 2023-07-16 DIAGNOSIS — M545 Low back pain, unspecified: Secondary | ICD-10-CM | POA: Diagnosis not present

## 2023-07-22 DIAGNOSIS — G4732 High altitude periodic breathing: Secondary | ICD-10-CM | POA: Diagnosis not present

## 2023-07-22 DIAGNOSIS — G4733 Obstructive sleep apnea (adult) (pediatric): Secondary | ICD-10-CM | POA: Diagnosis not present

## 2023-08-05 DIAGNOSIS — H1045 Other chronic allergic conjunctivitis: Secondary | ICD-10-CM | POA: Diagnosis not present

## 2023-08-05 DIAGNOSIS — J3089 Other allergic rhinitis: Secondary | ICD-10-CM | POA: Diagnosis not present

## 2023-08-23 IMAGING — US US ABDOMEN COMPLETE
1 series · 13 of 25 positions shown · non-contrast
Comparison: No prior exams available for comparison.

CLINICAL DATA: History of abnormal findings in liver on outside
images.

EXAM:
ABDOMEN ULTRASOUND COMPLETE

[Series 1: us abdomen complete · 0.23mm/px · 13 of 90 slices shown]
[im 1/90]
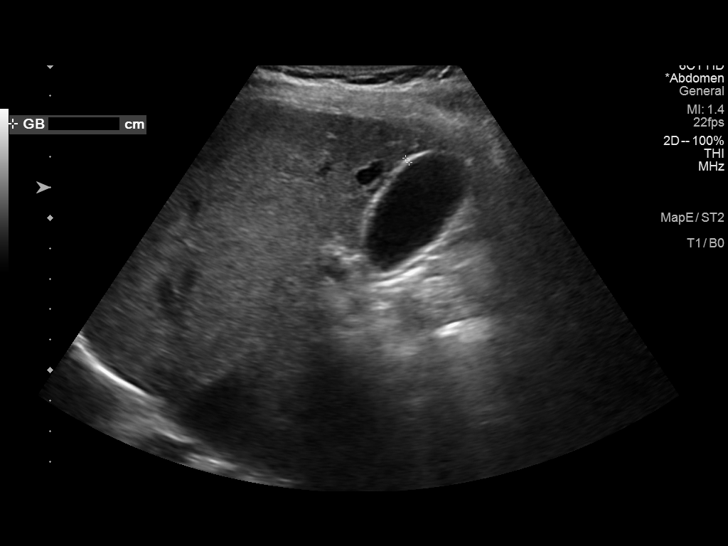
[im 8/90]
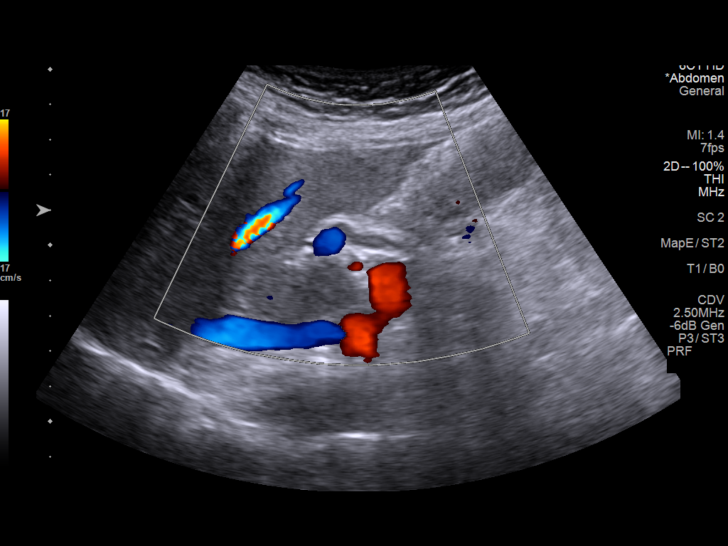
[im 15/90]
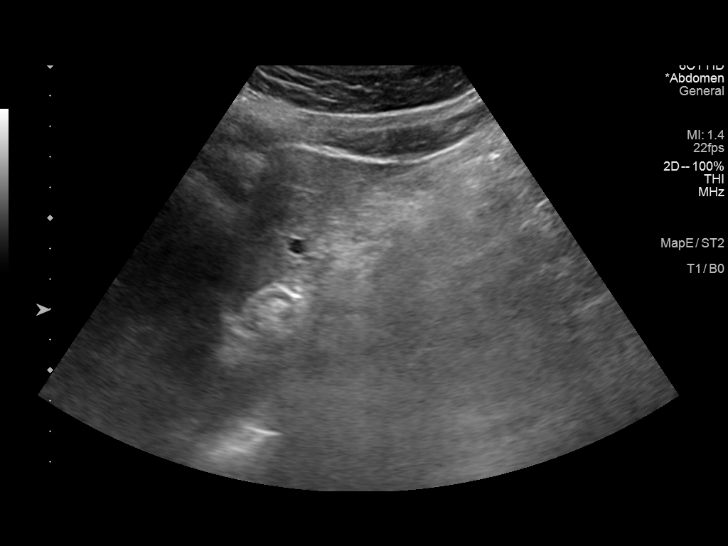
[im 23/90]
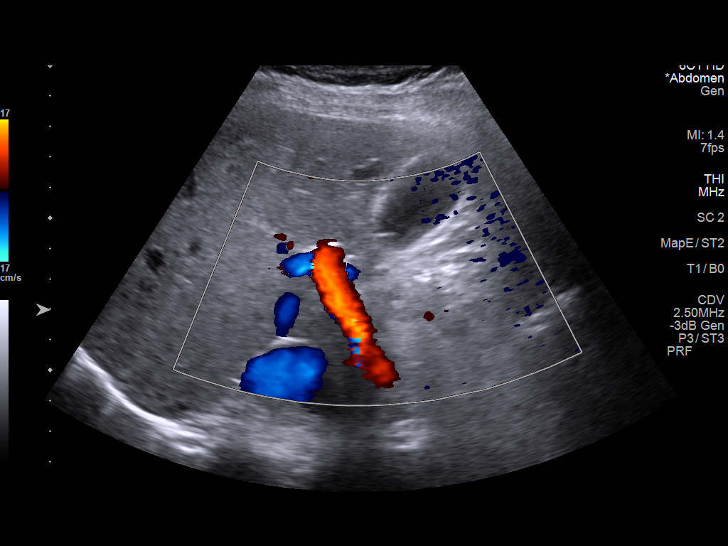
[im 30/90]
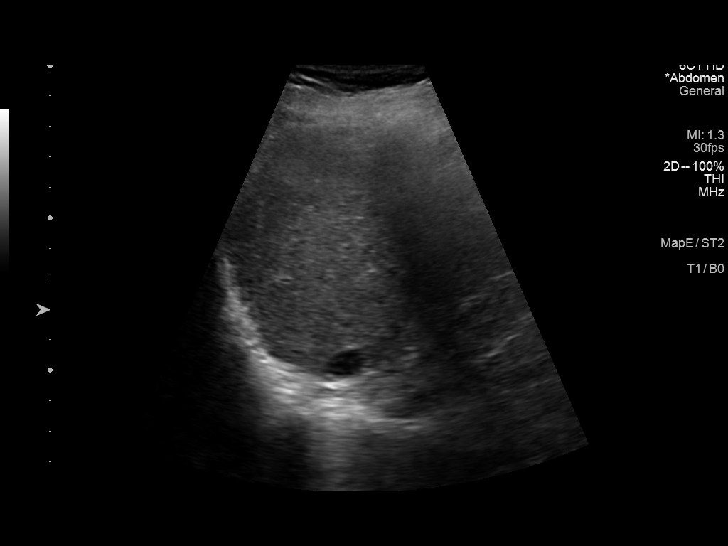
[im 38/90]
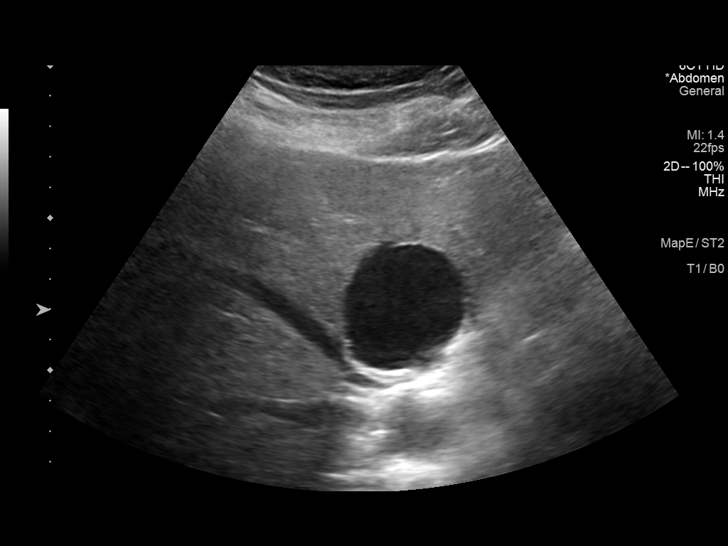
[im 45/90]
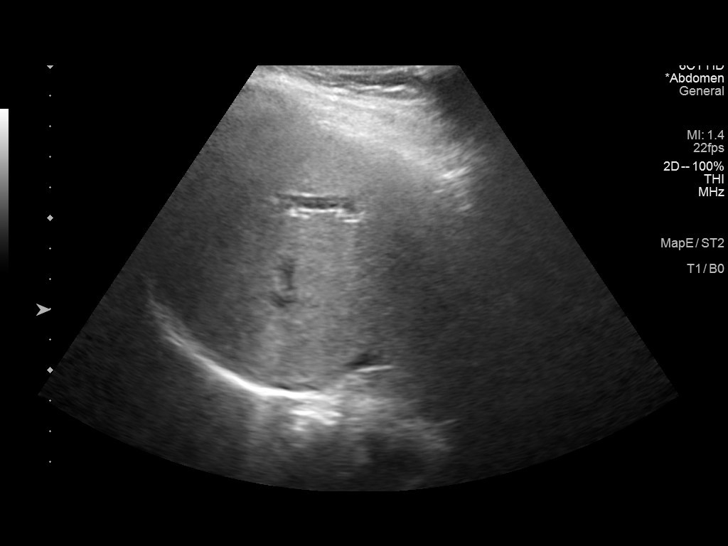
[im 52/90]
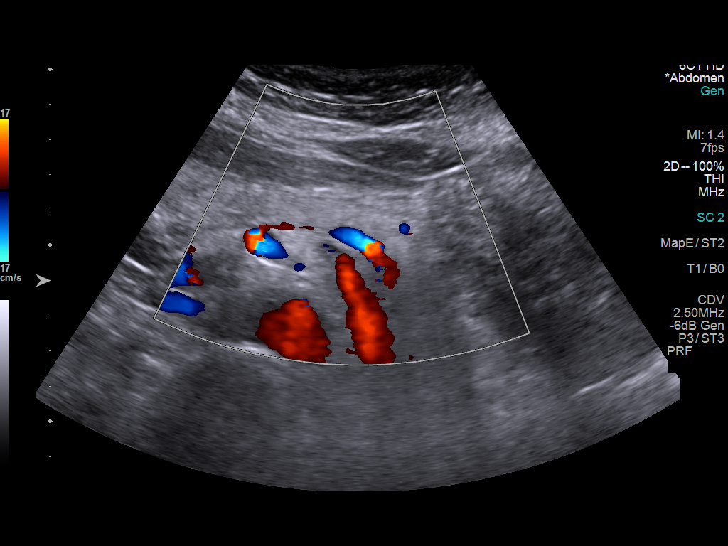
[im 60/90]
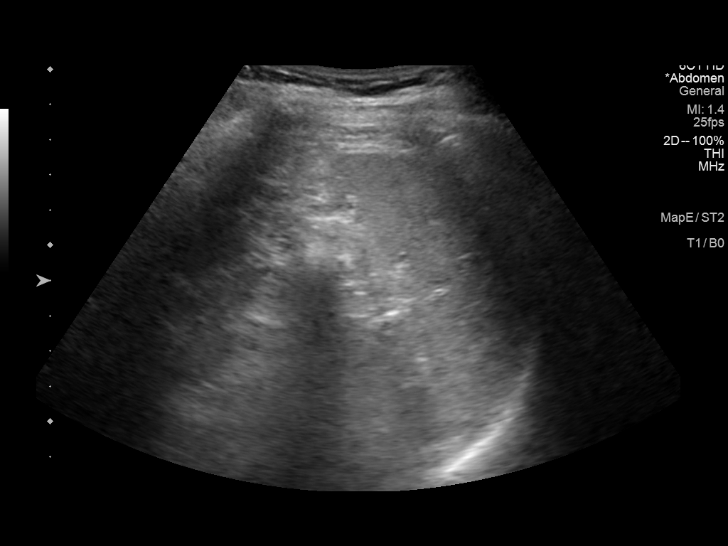
[im 67/90]
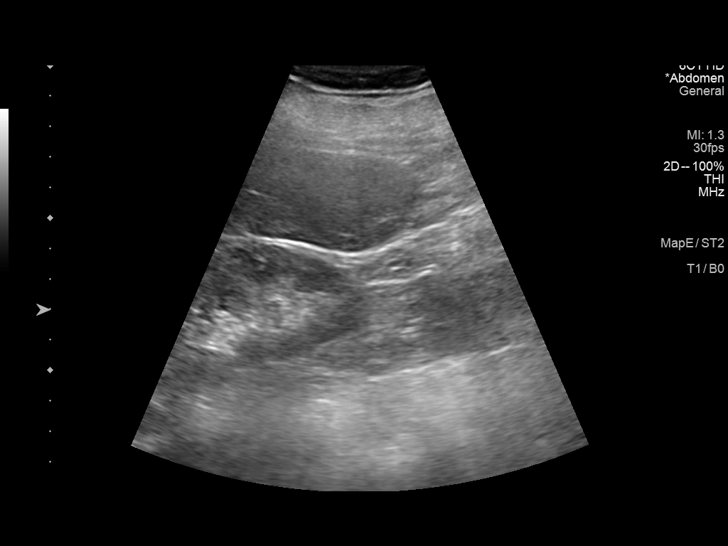
[im 75/90]
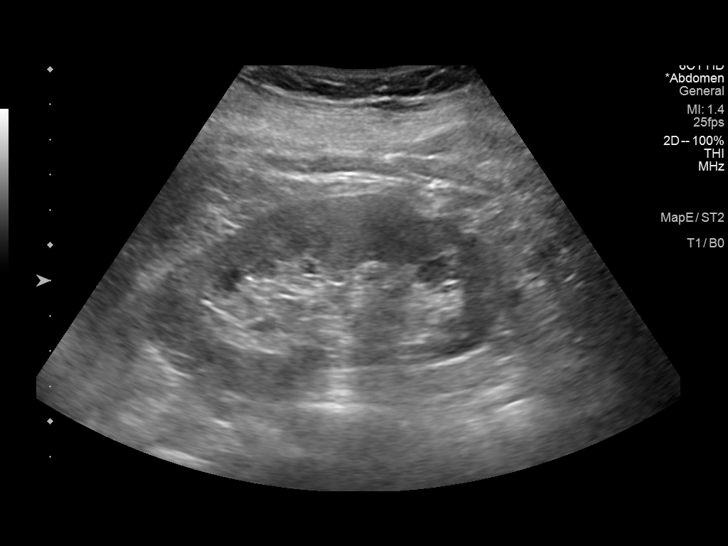
[im 82/90]
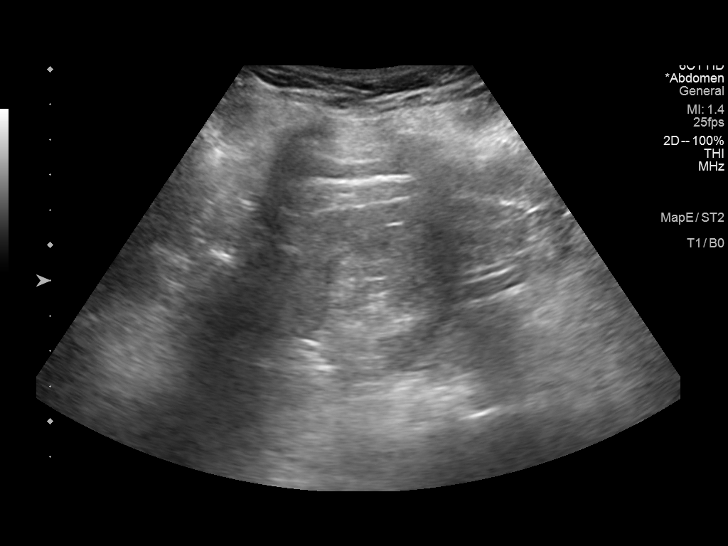
[im 90/90]
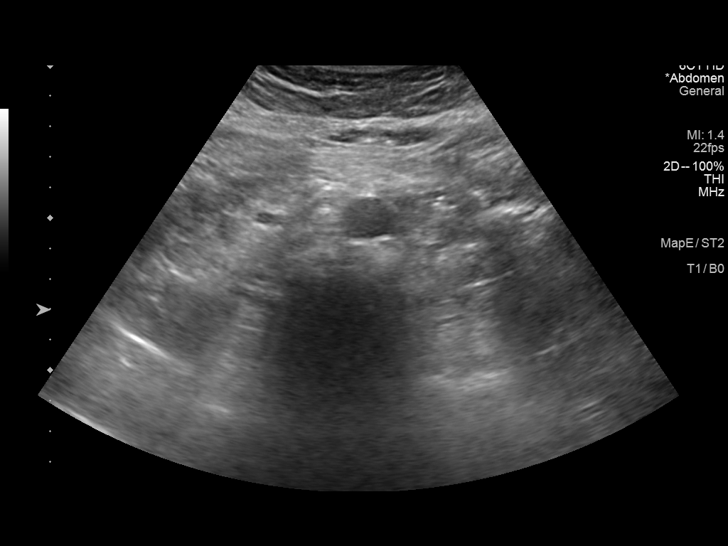

[13 of 25 positions shown; findings below may reference images not displayed]

FINDINGS: Gallbladder: No gallstones or wall thickening visualized. No
sonographic Murphy sign noted by sonographer.

Common bile duct: Diameter: 4.1 mm

Liver: A large 4.7 cm benign-appearing cyst is noted the left
hepatic lobe. Tiny smaller cysts are noted throughout the liver. No
solid hepatic mass lesion noted. Liver parenchyma is slightly
heterogeneous, fatty infiltration or hepatocellular disease cannot
be excluded. Portal vein is patent on color Doppler imaging with
normal direction of blood flow towards the liver.

IVC: No abnormality visualized.

Pancreas: Visualized portion unremarkable.

Spleen: Size and appearance within normal limits.

Right Kidney: Length: 10.0 cm. Echogenicity within normal limits. No
mass or hydronephrosis visualized.

Left Kidney: Length: 10.9 cm. Echogenicity within normal limits. No
mass or hydronephrosis visualized.

Abdominal aorta: No aneurysm visualized.

Other findings: None.
IMPRESSION: 1. Large 4.7 cm benign-appearing cyst is noted in the left hepatic
lobe. Tiny smaller cysts are noted throughout the liver. No solid
hepatic mass noted. Liver parenchyma slightly heterogeneous, fatty
infiltration and or hepatocellular disease cannot be excluded.

2.  Exam is otherwise normal.  No gallstones or biliary distention.

## 2023-08-24 DIAGNOSIS — M545 Low back pain, unspecified: Secondary | ICD-10-CM | POA: Diagnosis not present

## 2023-09-02 DIAGNOSIS — M8588 Other specified disorders of bone density and structure, other site: Secondary | ICD-10-CM | POA: Diagnosis not present

## 2023-09-02 DIAGNOSIS — N951 Menopausal and female climacteric states: Secondary | ICD-10-CM | POA: Diagnosis not present

## 2023-09-02 DIAGNOSIS — Z0000 Encounter for general adult medical examination without abnormal findings: Secondary | ICD-10-CM | POA: Diagnosis not present

## 2023-09-02 DIAGNOSIS — Z79899 Other long term (current) drug therapy: Secondary | ICD-10-CM | POA: Diagnosis not present

## 2023-09-02 DIAGNOSIS — E7800 Pure hypercholesterolemia, unspecified: Secondary | ICD-10-CM | POA: Diagnosis not present

## 2023-09-02 DIAGNOSIS — G43909 Migraine, unspecified, not intractable, without status migrainosus: Secondary | ICD-10-CM | POA: Diagnosis not present

## 2023-12-01 DIAGNOSIS — G4733 Obstructive sleep apnea (adult) (pediatric): Secondary | ICD-10-CM | POA: Diagnosis not present

## 2023-12-06 DIAGNOSIS — L570 Actinic keratosis: Secondary | ICD-10-CM | POA: Diagnosis not present

## 2023-12-06 DIAGNOSIS — D1801 Hemangioma of skin and subcutaneous tissue: Secondary | ICD-10-CM | POA: Diagnosis not present

## 2023-12-06 DIAGNOSIS — L814 Other melanin hyperpigmentation: Secondary | ICD-10-CM | POA: Diagnosis not present

## 2023-12-06 DIAGNOSIS — D225 Melanocytic nevi of trunk: Secondary | ICD-10-CM | POA: Diagnosis not present

## 2023-12-06 DIAGNOSIS — D2271 Melanocytic nevi of right lower limb, including hip: Secondary | ICD-10-CM | POA: Diagnosis not present

## 2023-12-06 DIAGNOSIS — L821 Other seborrheic keratosis: Secondary | ICD-10-CM | POA: Diagnosis not present

## 2023-12-08 DIAGNOSIS — J019 Acute sinusitis, unspecified: Secondary | ICD-10-CM | POA: Diagnosis not present

## 2023-12-27 DIAGNOSIS — Z1231 Encounter for screening mammogram for malignant neoplasm of breast: Secondary | ICD-10-CM | POA: Diagnosis not present

## 2024-06-21 DIAGNOSIS — G4732 High altitude periodic breathing: Secondary | ICD-10-CM | POA: Diagnosis not present

## 2024-06-21 DIAGNOSIS — G4733 Obstructive sleep apnea (adult) (pediatric): Secondary | ICD-10-CM | POA: Diagnosis not present

## 2024-06-22 ENCOUNTER — Other Ambulatory Visit: Payer: Self-pay | Admitting: Medical Genetics

## 2024-06-28 DIAGNOSIS — G4732 High altitude periodic breathing: Secondary | ICD-10-CM | POA: Diagnosis not present
